# Patient Record
Sex: Male | Born: 2000 | Race: White | Hispanic: No | Marital: Single | Smoking: Never smoker
Health system: Southern US, Community
[De-identification: ages and names within clinical notes are randomized; demographics above are authoritative.]

---
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
## 2019-01-03 ENCOUNTER — Ambulatory Visit: Payer: Self-pay | Admitting: *Deleted

## 2019-01-03 DIAGNOSIS — Z1159 Encounter for screening for other viral diseases: Secondary | ICD-10-CM | POA: Diagnosis not present

## 2019-01-03 NOTE — Telephone Encounter (Signed)
Mother, Abdullahi Vallone calling in to see if her son can be tested.   I let her know Tigran would need an order from his dr. Mother is going to call Dr. Jori Moll Pudio's office.   I let her know if Che needs to be tested the office would send [place] the order and we would call him to schedule the test.  She verbalized understanding and is going to call Dr. Sherron Flemings.

## 2019-01-14 ENCOUNTER — Ambulatory Visit (INDEPENDENT_AMBULATORY_CARE_PROVIDER_SITE_OTHER): Payer: 59 | Admitting: Psychologist

## 2019-01-14 ENCOUNTER — Encounter: Payer: Self-pay | Admitting: Psychologist

## 2019-01-14 ENCOUNTER — Other Ambulatory Visit: Payer: Self-pay

## 2019-01-14 DIAGNOSIS — R488 Other symbolic dysfunctions: Secondary | ICD-10-CM | POA: Diagnosis not present

## 2019-01-14 DIAGNOSIS — F900 Attention-deficit hyperactivity disorder, predominantly inattentive type: Secondary | ICD-10-CM | POA: Diagnosis not present

## 2019-01-14 DIAGNOSIS — R278 Other lack of coordination: Secondary | ICD-10-CM

## 2019-01-14 NOTE — Progress Notes (Signed)
Patient ID: Ronald Watkins, male   DOB: 09/18/2000, 18 y.o.   MRN: [id number] Psychological intake 3:10 PM to 3:55 PM with patient and father.  Presenting concerns: Ronald Watkins has a long history of struggling with ADHD and its comorbid executive function deficits.  Cognitive and academic processing speed and memory.  It is been over 3 years since his last psychological/psychoeducational evaluation and Ronald Watkins is requiring an updated evaluation in order for him to qualify for appropriate academic accommodations.  Historically throughout high school, Ronald Watkins received extended time on tests, testing in a separating quiet environment, and a set of class notes.  Mental status: Ronald Watkins mood is euthymic and his affect is broad and appropriate.  His speech is goal-directed and the content is productive.  His thoughts are clear, coherent, relevant and rational.  There are no significant issues with depression, anxiety, anger or aggression.  No history or concerns regarding drug or alcohol use.  No suicidal or homicidal ideation.  Hearing and vision are intact.  He is oriented to person place and time.  Judgment and insight are intact.  Social relationships are excellent.  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  Brief medical history: Ronald Watkins reports no known hospitalizations, surgeries, or head injuries.  He reports no reoccurring illnesses.  Family medical history as documented in the medical record.  Current medications include Intuniv 4 mg prescribed to treat his ADHD.  He is on no other medications at this time.  Diagnoses: ADHD: Inattention subtype, dysgraphia, rule out learning disorder  Plan: Psychological testing

## 2019-01-15 ENCOUNTER — Encounter: Payer: Self-pay | Admitting: Psychologist

## 2019-01-15 ENCOUNTER — Ambulatory Visit (INDEPENDENT_AMBULATORY_CARE_PROVIDER_SITE_OTHER): Payer: 59 | Admitting: Psychologist

## 2019-01-15 ENCOUNTER — Other Ambulatory Visit: Payer: Self-pay

## 2019-01-15 DIAGNOSIS — Z0000 Encounter for general adult medical examination without abnormal findings: Secondary | ICD-10-CM | POA: Diagnosis not present

## 2019-01-15 DIAGNOSIS — R278 Other lack of coordination: Secondary | ICD-10-CM

## 2019-01-15 DIAGNOSIS — Z7189 Other specified counseling: Secondary | ICD-10-CM | POA: Diagnosis not present

## 2019-01-15 DIAGNOSIS — F988 Other specified behavioral and emotional disorders with onset usually occurring in childhood and adolescence: Secondary | ICD-10-CM | POA: Diagnosis not present

## 2019-01-15 DIAGNOSIS — F900 Attention-deficit hyperactivity disorder, predominantly inattentive type: Secondary | ICD-10-CM

## 2019-01-15 DIAGNOSIS — Z713 Dietary counseling and surveillance: Secondary | ICD-10-CM | POA: Diagnosis not present

## 2019-01-15 DIAGNOSIS — R488 Other symbolic dysfunctions: Secondary | ICD-10-CM

## 2019-01-15 NOTE — Progress Notes (Signed)
Patient ID: Ronald Watkins, male   DOB: 2001/03/10, 18 y.o.   MRN: [id number] Psychological testing 9 AM to 11:55 AM +1-hour for scoring.  Administered the Wechsler Adult Intelligence Scale-4, Everlean Alstrom reading test, and portions of the Woodcock-Johnson achievement test battery.  I will complete the evaluation tomorrow and provide feedback and recommendations to patient and parents.  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

## 2019-01-16 ENCOUNTER — Encounter: Payer: Self-pay | Admitting: Psychologist

## 2019-01-16 ENCOUNTER — Other Ambulatory Visit: Payer: Self-pay

## 2019-01-16 ENCOUNTER — Ambulatory Visit (INDEPENDENT_AMBULATORY_CARE_PROVIDER_SITE_OTHER): Payer: 59 | Admitting: Psychologist

## 2019-01-16 DIAGNOSIS — F900 Attention-deficit hyperactivity disorder, predominantly inattentive type: Secondary | ICD-10-CM

## 2019-01-16 DIAGNOSIS — R488 Other symbolic dysfunctions: Secondary | ICD-10-CM | POA: Diagnosis not present

## 2019-01-16 DIAGNOSIS — R278 Other lack of coordination: Secondary | ICD-10-CM

## 2019-01-16 DIAGNOSIS — F810 Specific reading disorder: Secondary | ICD-10-CM | POA: Diagnosis not present

## 2019-01-16 NOTE — Progress Notes (Signed)
Patient ID: Ronald Watkins, male   DOB: 16-Aug-2000, 18 y.o.   MRN: [id number] Psychological testing 8:30 AM to 10:15 AM +2 hours for report.  Completed the Everlean Alstrom reading test, Woodcock-Johnson achievement test battery, Wide Range Assessment of Memory and Learning, Conners continuous performance test, and Developmental Test of Visual Motor Integration.  I will conference with patient and parent to discuss results and recommendations.  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

## 2019-01-16 NOTE — Progress Notes (Addendum)
Patient ID: Ronald Watkins, male   DOB: Jan 21, 2001, 18 y.o.   MRN: [id number] Psychological testing feedback session 10:30 AM to 11:30 AM with patient and father.  Discussed results of the psychological evaluation.  On the [place] performed in the well above average to superior range of verbal intellectual ability which represents the best estimate of his intellectual potential.  Academically, he displayed strengths, above age and grade level, and his math reasoning ability and writing composition skills.  Word decoding skills are adequate.  In the memory realm, he displayed solidly average auditory memory.  On the other hand, the data yield several areas of concern.  First, the data remain consistent with his previous diagnoses of ADHD and dysgraphia.  Second, the data are consistent with a diagnosis of a reading disorder in the areas of comprehension and recall.  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  Numerous recommendations and accommodations were discussed.  A report will be prepared that can be shared with the appropriate university personnel.  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   MEDICAL RECORD NO.: [id number]   REASON FOR REFERRAL/BRIEF BACKGROUND INFORMATION:   Jilberto has been followed by this subspecialty clinic since May of 2006 for the ongoing assessment and treatment of his neurodevelopmental dysfunctions in attention, graphomotor processing, memory, cognitive processing speed, and learning.  His diagnoses include ADHD, dysgraphia, reading disorder (in the areas of processing  speed/fluency and recall), and neurodevelopmental dysfunction/functional limitations in auditory working memory, visual working memory, overall visual memory, and cognitive processing speed.  Throughout his academic career, Juwuan received accommodations through a 504 classification.  Those accommodations included extended time on tests, testing in a separate and quiet environment as necessary, access to digital technology, and a set of class/lecture notes.  Further, Datrell received extended time for both his ACT and SAT exams.  Presently, Mabel was referred for a reevaluation of his cognitive, intellectual, academic, memory, and attention strengths/weaknesses to aid in academic planning.  The reader interested in more background information is referred to the electronic medical record where there is a comprehensive developmental database, medical history, family medical history, and copy of previous neurodevelopmental and psychological evaluations.  Kazim is prescribed medication for the treatment of his ADHD, and he was tested on medication both dates.    BASIS OF EVALUATION: Wechsler Adult Intelligence Scale-IV Woodcock-Johnson IV Tests of Achievement Wide-Range Assessment of Memory and Learning-II Developmental Test of Visual Motor Integration Conners Continuous Performance Test-3 ADHD Rating Scales   RESULTS OF THE EVALUATION: On the Wechsler Adult Intelligence Scale-Fourth Edition (WAIS-IV), Sanjay achieved a Verbal Comprehension Index standard score of 118 and a percentile rank of 88 placing him in the well above average to superior range of verbal intelligence.  The Verbal Comprehension Index is deemed the most valid and reliable indicator of Benjimin's current level of intellectual functioning given the rather extreme scatter among the individual indices.  Omero's index scores and scaled scores are as follows:   Domain  Standard Score   Percentile Rank Verbal Comprehension  Index            118 88 Perceptual Reasoning Index                96 39 Working Memory Index                    100 50 Processing Speed Index                       86 18 Full Scale IQ                                       102 55  Verbal    Comprehension Subtests Scaled Score             Similarities 16  Vocabulary 12  Information 12   Working     Metallurgist Score              Digit Span 9  Arithmetic  11   Perceptual  Reasoning Subtests  Scaled Score Block Design 7 Matrix Reasoning 11 Figure Weights 10  Processing  Speed Subtests  Scaled Score Coding 7 Symbol Search 8  * Please note, all scaled scores have a mean of 10 and a standard deviation of three.    On the Verbal Comprehension Index, Freddi performed in the well above average to superior range of intellectual functioning and at approximately the 90th percentile.  Overall, he displayed exceptional ability to access and apply acquired word knowledge.  Maurico was able to verbalize meaningful concepts, think about verbal information, and express himself using words with complete ease.  His high scores in this area are indicative of a very superior verbal reasoning system with strong word knowledge acquisition, effective information retrieval, well developed fund of general knowledge, very superior ability to reason and solve verbal problems, and effective communication of knowledge.  Delane's performance across the different subtests from this domain was somewhat discrepant.  On the one hand, Quindon displayed a significant strength, in the very superior and gifted range of functioning, in his verbal abstract reasoning and problem solving ability.  On the other hand, Mitsugi's verbal concept formation, word knowledge, and fund of general knowledge, while not as strong, were all still measured in the above average range of functioning.   On the Perceptual Reasoning Index, Wyland performed in the average range of intellectual  functioning and at approximately the 40th percentile.  Overall, he displayed age appropriate ability to evaluate visual details and understand visual spatial relationships.  Carney was able to apply spatial reasoning and analyze visual details as well as a typical age peer.  However, Xai's performance across the different subtests from this domain was somewhat discrepant.  On the one hand, Abdirahman displayed a relative strength, solidly average, in his broad visual intelligence, visual quantitative reasoning, and abstract visual thinking.  On the other hand, Jamol displayed a relative weakness, in the below average range of functioning, when solving visual problems that involved a timed motor response.    On the Working Memory Index, Gee performed in the average range of functioning and at the 50th percentile.  Overall, he displayed an average ability to register, maintain, and manipulate auditory information in conscious awareness.  When tested on medication, Delroy was able to remember one piece of auditory information while  performing a second mental or cognitive task as well as a typical age peer.    On the Processing Speed Index, Cher performed in the below average range of functioning and at only the 18th percentile.  He displayed a moderate neurodevelopmental dysfunction and functional limitation/deficit in his speed and accuracy of visual identification, decision making, and decision implementation.  Tariq struggled to rapidly identify, register, and implement decisions under time pressures.  In fact, cognitive processing speed is one of Renner's weakest areas of cognitive development.     On the Woodcock-Johnson IV Tests of Achievement, Saber achieved the following scores using norms based on his age:         Standard Score  Percentile Rank Basic Reading Skills 106 65    Letter-Word Identification 856 59    Word Attack 108 70  Reading Comprehension Skills 85 15   Passage Comprehension 86 17    Reading Recall  89 22  Math Calculation Skills 116 85   Calculation 108 71   Math Facts Fluency 119 89  Math Problem Solving 120 91   Applied Problems 108 71   Number Matrices 130 98  Written Expression 107 68   Writing Samples 109 72   Sentence Writing Fluency 102 54   On the reading portion of the achievement test battery, Valeriano's performance across the different subtests was quite discrepant.  On the one hand, Lyan displayed solidly average to even above average, and on to above age and grade level, word decoding skills.  Both his sight word recognition and phonological processing skills are well developed.  On the other hand, Nathanal displayed a moderate neurodevelopmental dysfunction and functional limitation/deficit, in the below average range of functioning, and at only the 15th percentile, and a full 6-1/2 grade levels behind (grade equivalent 6.6), in his reading comprehension and reading recall ability.  Ayson struggled to answer questions on a [place]ze reading task.  Further, he displayed a functional deficit in his ability to read, remember, and retell details from short stories.    On the math portion of the achievement test battery, Stephanos performed in the above average to superior range of functioning and substantially above both age and grade level.  He displayed well developed knowledge of basic math facts, basic calculation skills, and math reasoning ability.  Arney was able to deconstruct multioperational word problems with relative ease and generalize math concepts with ease.    On the written language portion of the achievement test battery, Sanjuan performed in the average to above average range of functioning and at approximately the 70th percentile.  His performance was on to well above age and grade level.  In particular, Clearence displayed well developed writing composition skills.  His compositions were thoughtful, cogent, comprehensible, comprehensive, and filled with creative  detail.    On the Wide-Range Assessment of Memory and Learning-II, Elder achieved the following scores:   Verbal Memory Standard Score: 102  Percentile Rank: 55   Visual Memory Standard Score: 82  Percentile Rank: 12  These data indicate that Dayshaun's overall general memory skills are discrepant.  On the one hand, Joseandres displayed solidly average general auditory memory ability.  He was able to remember an adequate amount of details from stories and word lists that were read to him.  In particular, Jakarri displayed an excellent auditory learning curve, remembering significantly more information with repeated auditory rehearsals of that information.  On the other hand, Jon displayed a moderate neurodevelopmental dysfunction, and functional limitation/deficit, in  his overall visual memory.  Milledge struggled to remember details from pictures and designs that were shown to him.  Both his visual recall and visual recognition memory skills are impaired.    On the Developmental Test of Visual Motor Integration, Loren achieved a standard score of 78 and a percentile rank of 7.  These data indicate that Derreck's graphomotor/fine motor skills are in the below average to borderline range of functioning.  The data remain consistent with his previous diagnosis of dysgraphia.  Rockland displayed numerous qualitative fine motor differences including a mild intention tremor and mild motor planning problems.    When tested on medication, results from the Conners Continuous Performance Test-3 were in the nonclinical range of functioning.  These data support the conclusion that Caden's ADHD medication is having a therapeutic benefit.  However, results from the ADHD Rating Scales, indicate that even on medication, Won continues to struggle with 4 out of 9 inattention subtype criteria including sustained attention and distractibility.    SUMMARY: In summary, the data indicate that Jerron is a young man of above average to  superior verbal comprehension and verbal intellectual ability.  He displayed very superior and gifted verbal abstract reasoning and practical reasoning ability and well developed verbal concept formation, word knowledge, and fund of general knowledge.  Further, he displayed solidly average, and age appropriate broad visual intelligence, abstract visual thinking, and visual/spatial processing ability.  Academically, [name] displayed strengths, in the above average to superior range of functioning, and substantially above age and grade level, in his math reasoning, knowledge of basic math facts, and math computational skills.  He also displayed a relative strength in his writing composition ability and word decoding skills.  In the memory realm, Askari displayed solidly average overall auditory memory.  On the other hand, the data continue to yield multiple areas of concern.  First, the data remain consistent with his previous diagnosis of ADHD.  Second, the data remain consistent with his previous diagnosis of dysgraphia.  Third, the data are consistent with a diagnosis of a moderate reading disorder in the areas of comprehension and recall.  Fourth, Torben displayed a moderate neurodevelopmental dysfunction and functional limitation/deficit in his cognitive/mental processing speed.  Finally, [name] displayed a moderate neurodevelopmental dysfunction in his overall visual memory ability.    DIAGNOSTIC CONCLUSIONS: 1. Above Average to [employer] (best estimate of overall intellectual ability).   2. ADHD (as previously diagnosed).  3. Dysgraphia (as previously diagnosed).  4. Reading Disorder:  moderate in the areas of comprehension and recall.   5. 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   RECOMMENDATIONS:   1. It is recommended that the results of this evaluation be shared  with the appropriate college/university personnel.  Given the constellation of Taylon's neurodevelopmental dysfunctions in attention, memory, reading, and cognitive processing speed, it is recommended that he receive extended time at a rate of 1-1/2 times normal rate for all tests, testing in a separate and quiet environment as necessary, access to digital technology (i.e., laptop, Smart Pen, digital books, voice to text/text to voice capacity, etc.), preferential seating, a set of lecture notes, and preferential registration to ensure that he is able to take classes at a time when his ADHD medication is at its most optimal level.  Norman's neurodevelopmental dysfunctions in cognitive processing speed, memory and reading will make it very difficult for him to keep pace with academic demands when there are  significant time pressures.  He will have difficulty keeping up with these rapid academic demands in large part because of his reading disorder, but also because of his processing speed deficits, his memory deficits, and his attention deficits.  These neurodevelopmental dysfunctions are likely to force Safi to have to compensate by rereading passages several times before he fully retains information, and rereading math problems multiple times before he fully understands what operations to perform and in what order to perform them.  Braylon's attention deficits will make sustained attention and sustained mental effort difficult for him.  Therefore, testing under time pressures will most likely yield a gross underestimate of his mastery of the material.    2. Following are general suggestions regarding Claus's reading comprehension and reading recall disorder:     A. The best way to begin any reading assignment is to skim the pages to get an  overall view of what information is included.  Then read the text carefully, word for word, and highlight the text and/or take notes in your notebook.                 Alvy Bimler should participate actively while reading and studying.  For example, he needs to acquire the habit of writing while he reads, learning to underline, to circle key words, to place an asterisk in the margin next to important details, and to inscribe comments in the margins when appropriate.  These habits over time will help Francisco read for content and should improve his comprehension and recall.                 Vinnie Langton should practice reading by breaking up paragraphs into specific meaningful components.  For example, he should first read a paragraph to discern the main idea, then, on a separate sheet of paper, he should answer the questions who, what, where, when, and why.  Through this type of practice, Erbie should be able to learn to read and select salient details in passages while being able to reject the less relevant content details.  Additionally, it should help him to sequence the passage ideas or events into a logical order and help him differentiate between main ideas and supporting data.  Once Tilton has completed the process mentioned above, he should then practice re-telling and re-thinking the passage and its meaning into his own words.     D. In order to improve his comprehension, Erhard is encouraged to use the    following reading/study skills:  1. Before reading a passage or chapter, first skim the chapter heading and bold face material to discern the general gist of the material to be read.  2. Before reading the passage or chapter, read the end-of-chapter questions to determine what material the authors believe is important for the student to remember.  Next, write those questions down on a separate piece of paper to be answered while reading.    E. When reading to study for an examination, Javed needs to develop a deliberate    memory plan by considering questions such as the following:    1. What do I need to read for this test?  2. How much time will it take for me to  read it?  3. How much time should I allow for each chapter section?  4. Of the material I am reading, what do I have to memorize?  5. What techniques will I use to allow materials to get into my memory?  This  is where underlining, writing comments, or making charts and diagrams can strengthen reading memory.  6. What other tricks can I use to make sure I learn this material:  Should I use a tape recorder?  Should I try to picture things in my mind?  Should I use a great deal of repetition?  Should I concentrate and study very hard just before I go to sleep?    7. How will I know when I know?  What self-testing techniques can I use to test my knowledge of the material?   F. It is recommended that Brittan use a Restaurant manager, fast food to highlight  material.  For example, he could highlight main ideas in yellow, names and dates in green, and supporting data in pink.  This technique provides visual cues to aid with memory and recall.       G. READING MARGIN NOTES:        1. Underline important ideas you want to remember, and then write a key   word or draw a picture or symbol in the margin.  You should also underline and then write "Main Idea" or "MI" in margin.      2. Write a note or draw a picture or diagram in the margin that describes the   organizational structure the [facility] uses such as:  cause/effect, compare/contrast, temporal/sequential order.      3. Write numbers beside supporting details in the text and in the margin write       "SD" and the corresponding number, i.e., SD-1, SD-2, etc.      4. Write "EX" in the margin to indicate when the [employer] gives examples of       main ideas.      5. Circle unknown words and terms and write definition in margin.      6. Write any ideas or questions you have about the subject in the margin.        Relating information in the text to what you already know and your own       experience helps you understand and remember.      7. Star or  otherwise emphasize ideas or facts in the text that your teacher       talks about in class.  These are likely to be used in test questions.      8. Put a question mark beside any parts of the text or ideas which you have   trouble understanding as a reminder to ask about them or look up more information.      9. Whether you write words or draw pictures or symbols does not matter.        The purpose is to remind you what is important and/or what needs further   clarification.  Use the system that works best for you.  It will help to be consistent and use the same system for all subjects.    1. Do not go on to the next chapter or section until you have completed the following exercise:  2. Write definitions of all key terms.  3. Summarize important information in your own words.  4. Write any questions that will need clarification with the teacher.   H. Read With a Plan:  Sueo's plan should incorporate the following:   1. Learn the terms.   2. Skim the chapter.   3. Do a thorough analytical reading.   4. Immediately upon completing your thorough reading, review.   5.  Write a brief summary of the concepts and theories you need to    remember.   I. It is recommended that Howard utilize the SQ3R method for reading    comprehension.  In this method, Slyvester would first Survey or skim the material,  next he would generate Questions about the content that he is to read, then he would Read the material, then he would Recite the information that he had read by telling someone else that information, finally he would Review the whole passage again, verbalizing the information out loud to himself using his own words.    J. To increase reading fluency/speed, run your fingers underneath the words as you   read as a guide.  This trains your brain to read more quickly.  As your eyes not only follow your finger, but see further along the line at the same time.  You begin to see words grouped  together and create a more consistent and quicker visual flow.        3. Following are general suggestions regarding Trenten's neurodevelopmental dysfunction in visual memory:  A. Gabrien needs to use mnemonic strategies to help improve his memory skills.  For example, he should be taught how to remember information via imagery, rhymes, anagrams, or subcategorization.   B. It is recommended that Elishua be allowed to record his classroom lectures.  He could then re-listen to the academic lecture later to help supplement any gaps in his knowledge that might have occurred due to his memory or auditory processing weaknesses.  The [facility] is an excellent option.  C. It is important that Newton study in a quiet environment with a minimal amount of noise and distractions present.  He should not study in situations where music is playing, the TV is on, or other people are talking nearby.   D. Some research has demonstrated that reviewing test material (study guides, flashcards, notes) right before going to bed can improve memory/retention.      E. Other study/memory strategies to be utilize:  1.  Complete all assignments.  This includes not just doing and turning in the  homework but also reading all the assigned text.  Homework assignments are a professor's gift to students, a free grade.  Do not give away free grades.   2.   Spend minimum of 15-30 minutes reviewing notes for each class per day.                       3.   In class, sit near the front.  This reduces distractions and increases    attention.                            4.   For tests be selective and study in depth.  Spend a minimum of 45-60    minutes reviewing your test material starting 3 days before each test.                F. Maximize your memory:  Following are memory techniques:  . To improve memory increases the number of rehearsals and the input channels.  For example, get in the habit of Hearing the information,  Seeing the information, Writing the information, and Explaining out loud that information.  . Over learn information.  . Make mental links and associations of all materials to existing knowledge so that you give the new material context in  your mind.  . Systemize the information.  Always attempt to place material to be learned in some form of pattern.  Create a system to help you recall how information is organized and connected (see enclosed memory handout).  . Review is key.  Review very soon after the original learning and then space out additional review periods farther apart.  The best time to review is just as you are about to forget, but can still just remember.  4. Following are general suggestions regarding Bharat's attention disorder:  A. It is recommended that Jahiem be given preferential seating.  In particular, he will be most successful seated in the front row and to one extreme side or the other.  B. It is recommended that Avonte be allowed to use earplugs to block out auditory distractions when he is working individually at his desk or when taking tests.  C. It is recommended that when scheduling Mecca's classes that his more demanding academic classes be scheduled earlier in the day.  Individuals with ADHD fatigue over the course of the day.   D. It is recommended that Harlon be allowed to record his classroom lectures.     E. It is recommended that Virginio be given a set of peer notes or professor notes to    augment his study material.  5. Following are recommended study strategies:     A. Demetreus should use [employer] One Note to record his homework assignments   for each class.  He should notate that he completed each assignment and that he put each assignment in its proper place to be turned in on time.  B. Know the Professors:  Armonte should make an effort to understand each  professor's approach to their subjects, their expectations, standards, flexibility, etc.   Essentially, Maddoxx should compile a mental profile of each professor and be able to answer the questions:  What does this professor want to see in terms of notes, level of participation, papers, projects?  What are the professor's likes and dislikes?  What are the professor's methods of grading and testing?, etc.  C. Note Taking:  Sabrina should compile notes in two different arenas.  First,  Ledford should take notes from his textbooks.  Working from his books at home or in [employer], Karo should identify the main ideas, rephrase information in his own words, as well as capture the details in which he is unfamiliar.  He should take brief, concise notes in a separate computer notebook for each class.  Second, in class, Kenon should take notes that sequentially follow the teachers lecture pattern.  When class is complete, Kipp should review his notes at the first opportunity.  He will fill in any gaps or missing information either by tracking down that information from the textbook, from the teacher, or utilizing a copy of teacher notes.  D. Organize Your Time:  While it is important to specifically structure study time,  it is just as important to understand that one must study when one can and study whenever circumstances allow.  Initially, always identify those items on your daily calendar, that can be completed in 15 minutes or less.  These are the items that could be set aside to be completed during lunch, between text messages, etc.  It is recommended that Takoda use two tools for his daily planning organization.  First is [employer] One Note.  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  On the project board, Elad should schedule all of his long-term projects, papers, and scheduled tests/exams.  One important trick, when scheduling the due dates, it is recommended that Truman always schedule the completion date at least 2-3 days prior  to the actual turn in date so as to give Arlin a cushion for life circumstances as they arise.  With each paper, test and long term project then work backwards on the project board filling in what needs to be done week by week until completion (i.e.:  first draft, second draft, proofing, final draft and turn in).              E. The amount you learn, or the amount you write is directly related to the amount of   time you spend doing it.  If you want to be successful (maximize your grades, for instance), you will need to set aside time to work.  Following are some fundamentals of effective study:    1. Create a good and inviting work environment.  Try to keep a specific place  to study, make it appealing in your own way, and keep it clear of clutter and distractions.     2. Make a list beforehand of what you are going to work on.  List what you  are going to do, in what order you are going to do them, and the amount of time you plan to spend on each.  You can make "game time" changes as needed.    3. Keep the benefits of your study clearly in mind.  Remind yourself what the     end goal is and how this study moment contributes to it.    4. Always leave your study environment organized for the next session.  Put     papers, notes, and books where they should go.    5. Study in short periods.  Spend between 30-45 minutes at a time and then  take a short 5-10 minute break.  Use a timer to keep track of both your work time and rest time.    6. Divide big projects into individual smaller and manageable tasks.  Focus     on the demands of each smaller task one at a time.    F. Learn to be a good note taker.  Notetaking helps you organize the material,   increases your understanding and remembering of the material, and allows you to put information into your own words.      1. Taking lecture notes and notes on what you read helps you concentrate     and stay focused.  It keeps you actively  engaged.      2. Taking notes helps you to more easily remember the material.    3. Notetaking might include notes written in a linear fashion, the underlining  or highlighting of key points, making comments in the margins, the drawing of pictures/diagrams/graphs or spider diagrams.      4. It is always useful, as you get close to the exam, to rewrite and condense   your notes.  Essentially, make notes of your notes.  This helps you to rehearse the material, process the material, retrieve the material, all of which makes the information more readily accessible and easier to recall.               G. Time Management:  Always stop studying at a reasonable hour (i.e.:  10-11  p.m.).  It is important that Rocky Ripple study for 30-45 minutes at a time then take a 5-10 minute break.   H. Good study habits, a motivation to learn, and a positive attitude are key factors in   determining the success or the lack of success of ones educational pursuits.  Learn to avoid some of the common roadblocks to academic success:    1.  Lack of Discipline - One must learn to continue working toward their     goals, even when it is difficult, or stressful, or boring.  Get in the habit of  doing what you need to do, when you need to do it to the best of your ability, whether or not you like it or enjoy it.  Learn to get comfortable feeling uncomfortable.      2. Lack of Passion/Motivation - Motivation follows action.  Get busy and the  motivation will follow.  Create an image in your head of how you will feel when your goal is accomplished.      3. Lack of Focus - To counter focus issues, make a plan or a list that outlines     all the necessary steps to complete your task.  Now just complete one task     at a time until the job is done.  Knowing the steps makes the task easier.     4. Lack of Accountability - Be accountable to yourself.  Reward yourself  with task completion, withhold the reward for non-completion.   Share your goal, plan with someone else.  Sometimes it is harder to let someone else down than yourself.     5. Lack of Time - Practice breaking down tasks into 25-30 minute  intervals/segments and take a short 3-5 minute break in between.  Shorter work spurts increase productivity.      6. Too Many Negative Thoughts - Learn how to identify those negative  thoughts that diminish productivity and work ethic.  Confront and replace them with more successful outcome thoughts.     I. Test Taking Strategies:    1. Read through the whole test first.    2. Notice how many points each part of the test is worth.    3. Write down any specific formulas, principals, ideas or other details you     have memorized in the margins.    4. Answer the easiest questions first.    5. Answer all the objective parts first (these often give you clues for the     essay questions).    6. Answer the essay questions last.  Use a mind map to help organize your     ideas.      As always, this examiner is available to consult in the future as needed.    Respectfully,    REloise Harman, Ph.D.  Licensed Psychologist Clinical Director [place]  RML/tal

## 2019-01-24 MED FILL — guanFACINE HCL ER 4 MG TB24: 4 | 30 days supply | Qty: 30 | Fill #0

## 2019-02-12 DIAGNOSIS — F902 Attention-deficit hyperactivity disorder, combined type: Secondary | ICD-10-CM | POA: Diagnosis not present

## 2019-02-24 MED FILL — guanFACINE HCL ER 4 MG TB24: 4 | 30 days supply | Qty: 30 | Fill #1

## 2019-03-25 MED FILL — guanFACINE HCL ER 4 MG TB24: 4 | 90 days supply | Qty: 90 | Fill #0

## 2019-03-31 DIAGNOSIS — M542 Cervicalgia: Secondary | ICD-10-CM | POA: Diagnosis not present

## 2019-03-31 DIAGNOSIS — S0990XA Unspecified injury of head, initial encounter: Secondary | ICD-10-CM | POA: Diagnosis not present

## 2019-03-31 DIAGNOSIS — S098XXA Other specified injuries of head, initial encounter: Secondary | ICD-10-CM | POA: Diagnosis not present

## 2019-06-17 MED FILL — guanFACINE HCL ER 4 MG TB24: 4 | 90 days supply | Qty: 90 | Fill #1

## 2019-06-20 MED FILL — HYDROCODON-APAP 7.5-325: 7.5-325 | 2 days supply | Qty: 8 | Fill #0

## 2019-06-20 MED FILL — KETOROLAC 10 MG TABLET: 10 | 4 days supply | Qty: 16 | Fill #0

## 2019-08-06 DIAGNOSIS — Z03818 Encounter for observation for suspected exposure to other biological agents ruled out: Secondary | ICD-10-CM | POA: Diagnosis not present

## 2019-08-10 DIAGNOSIS — Z03818 Encounter for observation for suspected exposure to other biological agents ruled out: Secondary | ICD-10-CM | POA: Diagnosis not present

## 2019-09-18 MED FILL — guanFACINE HCL ER 4 MG TB24: 4 | 30 days supply | Qty: 30 | Fill #2

## 2019-10-20 MED FILL — guanFACINE HCL ER 4 MG TB24: 4 | 30 days supply | Qty: 30 | Fill #3

## 2019-12-01 MED FILL — guanFACINE HCL ER 4 MG TB24: 4 | 30 days supply | Qty: 30 | Fill #4

## 2019-12-29 MED FILL — guanFACINE HCL ER 4 MG TB24: 4 | 30 days supply | Qty: 30 | Fill #5

## 2020-01-26 MED FILL — guanFACINE HCL ER 4 MG TB24: 4 | 30 days supply | Qty: 30 | Fill #6

## 2020-02-12 ENCOUNTER — Other Ambulatory Visit (HOSPITAL_COMMUNITY): Payer: Self-pay | Admitting: Family Medicine

## 2020-02-12 DIAGNOSIS — Z23 Encounter for immunization: Secondary | ICD-10-CM | POA: Diagnosis not present

## 2020-02-12 DIAGNOSIS — R69 Illness, unspecified: Secondary | ICD-10-CM | POA: Diagnosis not present

## 2020-02-12 DIAGNOSIS — Z0000 Encounter for general adult medical examination without abnormal findings: Secondary | ICD-10-CM | POA: Diagnosis not present

## 2020-02-26 MED FILL — guanFACINE HCL ER 4 MG TB24: 4 | 30 days supply | Qty: 30 | Fill #0

## 2020-03-23 MED FILL — guanFACINE HCL ER 4 MG TB24: 4 | 30 days supply | Qty: 30 | Fill #1

## 2020-04-19 MED FILL — guanFACINE HCL ER 4 MG TB24: 4 | 30 days supply | Qty: 30 | Fill #2

## 2020-05-17 MED FILL — guanFACINE HCL ER 4 MG TB24: 4 | 30 days supply | Qty: 30 | Fill #3

## 2020-06-26 MED FILL — guanFACINE HCL ER 4 MG TB24: 4 | 30 days supply | Qty: 30 | Fill #4

## 2020-07-20 MED FILL — guanFACINE HCL ER 4 MG TB24: 4 | 30 days supply | Qty: 30 | Fill #5

## 2020-08-14 MED FILL — guanFACINE HCL ER 4 MG TB24: 4 | 30 days supply | Qty: 30 | Fill #6

## 2020-09-17 MED FILL — guanFACINE HCL ER 4 MG TB24: 4 | 30 days supply | Qty: 30 | Fill #7

## 2020-10-21 ENCOUNTER — Other Ambulatory Visit (HOSPITAL_COMMUNITY): Payer: Self-pay

## 2020-12-23 ENCOUNTER — Other Ambulatory Visit (HOSPITAL_COMMUNITY): Payer: Self-pay

## 2020-12-23 MED FILL — Guanfacine HCl Tab ER 24HR 4 MG (Base Equiv): ORAL | 30 days supply | Qty: 30 | Fill #0 | Status: AC

## 2020-12-24 ENCOUNTER — Other Ambulatory Visit (HOSPITAL_COMMUNITY): Payer: Self-pay

## 2021-01-01 ENCOUNTER — Other Ambulatory Visit (HOSPITAL_COMMUNITY): Payer: Self-pay

## 2021-01-03 ENCOUNTER — Other Ambulatory Visit (HOSPITAL_COMMUNITY): Payer: Self-pay

## 2021-02-22 ENCOUNTER — Other Ambulatory Visit (HOSPITAL_COMMUNITY): Payer: Self-pay

## 2021-02-22 MED ORDER — GUANFACINE HCL ER 4 MG PO TB24
ORAL_TABLET | ORAL | 3 refills | Status: AC
  Filled 2021-02-22: qty 90, 90d supply, fill #0
  Filled 2021-05-16: qty 90, 90d supply, fill #1

## 2021-05-16 ENCOUNTER — Other Ambulatory Visit (HOSPITAL_COMMUNITY): Payer: Self-pay

## 2022-03-02 DIAGNOSIS — Z0000 Encounter for general adult medical examination without abnormal findings: Secondary | ICD-10-CM | POA: Diagnosis not present

## 2022-03-02 DIAGNOSIS — F902 Attention-deficit hyperactivity disorder, combined type: Secondary | ICD-10-CM | POA: Diagnosis not present

## 2022-08-18 DIAGNOSIS — Z209 Contact with and (suspected) exposure to unspecified communicable disease: Secondary | ICD-10-CM | POA: Diagnosis not present

## 2022-08-25 DIAGNOSIS — F902 Attention-deficit hyperactivity disorder, combined type: Secondary | ICD-10-CM | POA: Diagnosis not present

## 2023-03-09 DIAGNOSIS — Z0000 Encounter for general adult medical examination without abnormal findings: Secondary | ICD-10-CM | POA: Diagnosis not present

## 2024-07-15 ENCOUNTER — Other Ambulatory Visit (HOSPITAL_COMMUNITY): Payer: Self-pay

## 2024-07-15 MED ORDER — GUANFACINE HCL ER 4 MG PO TB24
4.0000 mg | ORAL_TABLET | Freq: Every day | ORAL | 0 refills | Status: AC
  Filled 2024-07-15: qty 90, 90d supply, fill #0
